# Patient Record
Sex: Male | Born: 1990 | Race: Black or African American | Hispanic: No | Marital: Married | Smoking: Never smoker
Health system: Southern US, Community
[De-identification: ages and names within clinical notes are randomized; demographics above are authoritative.]

---
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
## 2013-09-25 ENCOUNTER — Emergency Department: Payer: Self-pay | Admitting: Emergency Medicine

## 2013-09-27 LAB — BETA STREP CULTURE(ARMC)

## 2014-02-03 ENCOUNTER — Emergency Department: Payer: Self-pay | Admitting: Emergency Medicine

## 2014-02-04 LAB — CBC
HCT: 41.7 % (ref 40.0–52.0)
HGB: 14.1 g/dL (ref 13.0–18.0)
MCH: 29.1 pg (ref 26.0–34.0)
MCHC: 34 g/dL (ref 32.0–36.0)
MCV: 86 fL (ref 80–100)
Platelet: 238 10*3/uL (ref 150–440)
RBC: 4.86 10*6/uL (ref 4.40–5.90)
RDW: 13.5 % (ref 11.5–14.5)
WBC: 5.7 10*3/uL (ref 3.8–10.6)

## 2014-02-04 LAB — COMPREHENSIVE METABOLIC PANEL
ALK PHOS: 70 U/L
ALT: 34 U/L (ref 12–78)
Albumin: 4 g/dL (ref 3.4–5.0)
Anion Gap: 4 — ABNORMAL LOW (ref 7–16)
BILIRUBIN TOTAL: 0.4 mg/dL (ref 0.2–1.0)
BUN: 15 mg/dL (ref 7–18)
CALCIUM: 8.4 mg/dL — AB (ref 8.5–10.1)
CHLORIDE: 105 mmol/L (ref 98–107)
CO2: 28 mmol/L (ref 21–32)
Creatinine: 1.21 mg/dL (ref 0.60–1.30)
EGFR (African American): >60
Glucose: 86 mg/dL (ref 65–99)
Osmolality: 274 (ref 275–301)
POTASSIUM: 3.8 mmol/L (ref 3.5–5.1)
SGOT(AST): 31 U/L (ref 15–37)
SODIUM: 137 mmol/L (ref 136–145)
Total Protein: 7.1 g/dL (ref 6.4–8.2)
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
## 2020-04-08 ENCOUNTER — Ambulatory Visit: Payer: Self-pay | Attending: Family

## 2020-04-08 DIAGNOSIS — Z23 Encounter for immunization: Secondary | ICD-10-CM

## 2020-04-08 NOTE — Progress Notes (Signed)
   Covid-19 Vaccination [facility]  Name:  DONELLE BABA    MRN: [id number] DOB: [birth year]  04/08/2020  Mr. Salzwedel was observed post Covid-19 immunization for 15 minutes without incident. He was provided with Vaccine Information Sheet and instruction to access the V-Safe system.   Mr. Baston was instructed to call 911 with any severe reactions post vaccine: Marland Kitchen Difficulty breathing  . Swelling of face and throat  . A fast heartbeat  . A bad rash all over body  . Dizziness and weakness   Immunizations Administered    Name Date Dose VIS Date Route   Pfizer COVID-19 Vaccine 04/08/2020  4:36 PM 0.3 mL 01/14/2019 Intramuscular   Manufacturer: [employer], [employer]   Lot: [id number]   NDC: [address]-1

## 2020-05-11 ENCOUNTER — Ambulatory Visit: Payer: Self-pay | Attending: Internal Medicine

## 2020-11-25 ENCOUNTER — Ambulatory Visit: Payer: Self-pay

## 2022-08-14 ENCOUNTER — Emergency Department
Admission: EM | Admit: 2022-08-14 | Discharge: 2022-08-14 | Disposition: A | Discharge status: Home / Self Care | Payer: BC Managed Care – PPO | Attending: Emergency Medicine | Admitting: Emergency Medicine

## 2022-08-14 ENCOUNTER — Other Ambulatory Visit: Payer: Self-pay

## 2022-08-14 DIAGNOSIS — H6592 Unspecified nonsuppurative otitis media, left ear: Secondary | ICD-10-CM

## 2022-08-14 DIAGNOSIS — H73892 Other specified disorders of tympanic membrane, left ear: Secondary | ICD-10-CM | POA: Diagnosis not present

## 2022-08-14 DIAGNOSIS — H938X2 Other specified disorders of left ear: Secondary | ICD-10-CM | POA: Diagnosis present

## 2022-08-14 NOTE — Discharge Instructions (Addendum)
You likely have fluid behind your eardrum which is causing the sensation of fullness and difficulty hearing.  This typically resolves on its own.  If you are still having symptoms in about 1 week please follow-up with the ear nose and throat doctors.  You can take ibuprofen for any discomfort.

## 2022-08-14 NOTE — ED Triage Notes (Signed)
Pt presents to ED with c/o of L ear issues with hearing. Pt states this started 4 days ago. Pt states everything he hears sounds muffled in L ear. No fever or chills noted.

## 2022-08-14 NOTE — ED Notes (Signed)
See triage note   Presents with some discomfort in left ear  Denies nay pain or fever  Describes as "muffled feeling in ear"

## 2022-08-14 NOTE — ED Provider Notes (Signed)
[facility] Provider Note    Event Date/Time   First MD Initiated Contact with Patient 08/14/22 1116     (approximate)   History   Otalgia   HPI  Richard Massey is a 31 y.o. male with no stated past medical history who presents with left ear fullness.  Is been going on for 4 days.  He feels like he cannot fully hear out of the ear.  He can hear some but just feels muffled.  Denies associated pain.  He used hydrogen peroxide and got a significant amount of wax out on his own.  Denies history of similar.  Denies any recent cold symptoms or congestion or runny nose     History reviewed. No pertinent past medical history.  There are no problems to display for this patient.    Physical Exam  Triage Vital Signs: ED Triage Vitals  Enc Vitals Group     BP 08/14/22 1043 (!) 155/99     Pulse Rate 08/14/22 1042 89     Resp 08/14/22 1042 17     Temp 08/14/22 1042 98.2 F (36.8 C)     Temp Source 08/14/22 1042 Oral     SpO2 08/14/22 1042 97 %     Weight --      Height --      Head Circumference --      Peak Flow --      Pain Score 08/14/22 1042 0     Pain Loc --      Pain Edu? --      Excl. in GC? --     Most recent vital signs: Vitals:   08/14/22 1042 08/14/22 1043  BP:  (!) 155/99  Pulse: 89   Resp: 17   Temp: 98.2 F (36.8 C)   SpO2: 97%      General: Awake, no distress.  CV:  Good peripheral perfusion.  Resp:  Normal effort.  Abd:  No distention.  Neuro:             Awake, Alert, Oriented x 3  Other:  Left TM is mildly erythematous with what appears to be an effusion but I do see a light reflex its not bulging there is no drainage from the external canal   ED Results / Procedures / Treatments  Labs (all labs ordered are listed, but only abnormal results are displayed) Labs Reviewed - No data to display   EKG     RADIOLOGY    PROCEDURES:  Critical Care performed: No  Procedures   MEDICATIONS ORDERED IN  ED: Medications - No data to display   IMPRESSION / MDM / ASSESSMENT AND PLAN / ED COURSE  I reviewed the triage vital signs and the nursing notes.                              Patient's presentation is most consistent with acute, uncomplicated illness.  Differential diagnosis includes, but is not limited to, serous otitis media, otitis media, otitis externa, cerumen impaction  Patient is a 31 year old male presents with difficulty hearing and a full feeling in the left ear for the last 4 days.  He got a significant amount of cerumen out on his own by using hydrogen peroxide but still feels like he cannot fully hear out of the left ear.  Denies congestion or recent cold symptoms.  On exam the TM is slightly erythematous but is  not bulging there does appear to be an effusion.  His lack of pain and lack of bulge suggest against otitis media.  Exam and presentation not consistent with otitis externa.  Suspect middle ear effusion.  Recommended watchful waiting and follow-up with ENT if symptoms or not improved.      FINAL CLINICAL IMPRESSION(S) / ED DIAGNOSES   Final diagnoses:  Fluid level behind tympanic membrane of left ear     Rx / DC Orders   ED Discharge Orders     None        Note:  This document was prepared using Dragon voice recognition software and may include unintentional dictation errors.   Rada Hay, MD 08/14/22 [phone]

## 2024-05-13 ENCOUNTER — Ambulatory Visit
Admission: RE | Admit: 2024-05-13 | Discharge: 2024-05-13 | Disposition: A | Discharge status: Home / Self Care | Payer: Self-pay | Source: Ambulatory Visit | Attending: Emergency Medicine | Admitting: Emergency Medicine

## 2024-05-13 VITALS — BP 149/92 | HR 91 | Temp 99.3°F | Resp 18

## 2024-05-13 DIAGNOSIS — H6123 Impacted cerumen, bilateral: Secondary | ICD-10-CM

## 2024-05-13 DIAGNOSIS — H9192 Unspecified hearing loss, left ear: Secondary | ICD-10-CM

## 2024-05-13 NOTE — ED Triage Notes (Signed)
 Patient to Urgent Care with complaints of left sided ear fullness/ ringing/ muffled hearing.   Symptoms started Saturday. Reports started with URI symptoms.

## 2024-05-13 NOTE — ED Provider Notes (Signed)
 [facility]    CSN: [id number] Arrival date & time: 05/13/24  1301      History   Chief Complaint Chief Complaint  Patient presents with   Ear Fullness    Cold; sore throat, hard to swallow, no hearing in left ear - Entered by patient    HPI Richard Massey is a 33 y.o. male.  Patient presents with muffled hearing and fullness in his left ear intermittently x 3 days.  He denies ear pain, ear drainage, fever, cough, shortness of breath.  He states his symptoms improved when he takes a hot shower but then returned once he is cooled down.  He is concerned for earwax buildup.  The history is provided by the patient and medical records.    History reviewed. No pertinent past medical history.  There are no active problems to display for this patient.   History reviewed. No pertinent surgical history.     Home Medications    Prior to Admission medications   Not on File    Family History History reviewed. No pertinent family history.  Social History Social History   Tobacco Use   Smoking status: Never   Smokeless tobacco: Never  Vaping Use   Vaping status: Never Used  Substance Use Topics   Alcohol use: Yes    Comment: social   Drug use: Never     Allergies   Patient has no known allergies.   Review of Systems Review of Systems  Constitutional:  Negative for chills and fever.  HENT:  Positive for hearing loss. Negative for ear discharge, ear pain and sore throat.   Respiratory:  Negative for cough and shortness of breath.      Physical Exam Triage Vital Signs ED Triage Vitals  Encounter Vitals Group     BP 05/13/24 1314 (!) 149/92     Girls Systolic BP Percentile --      Girls Diastolic BP Percentile --      Boys Systolic BP Percentile --      Boys Diastolic BP Percentile --      Pulse Rate 05/13/24 1314 91     Resp 05/13/24 1314 18     Temp 05/13/24 1314 99.3 F (37.4 C)     Temp src --      SpO2 05/13/24 1314 97 %     Weight  --      Height --      Head Circumference --      Peak Flow --      Pain Score 05/13/24 1312 0     Pain Loc --      Pain Education --      Exclude from Growth Chart --    No data found.  Updated Vital Signs BP (!) 149/92   Pulse 91   Temp 99.3 F (37.4 C)   Resp 18   SpO2 97%   Visual Acuity Right Eye Distance:   Left Eye Distance:   Bilateral Distance:    Right Eye Near:   Left Eye Near:    Bilateral Near:     Physical Exam Constitutional:      General: He is not in acute distress. HENT:     Right Ear: Tympanic membrane normal.     Left Ear: Tympanic membrane normal.     Ears:     Comments: Excessive cerumen in both ear canals but able to visualize both TMs which are clear.    Nose: Nose normal.  Mouth/Throat:     Mouth: Mucous membranes are moist.     Pharynx: Oropharynx is clear.   Cardiovascular:     Rate and Rhythm: Normal rate and regular rhythm.     Heart sounds: Normal heart sounds.  Pulmonary:     Effort: Pulmonary effort is normal. No respiratory distress.     Breath sounds: Normal breath sounds.   Neurological:     Mental Status: He is alert.      UC Treatments / Results  Labs (all labs ordered are listed, but only abnormal results are displayed) Labs Reviewed - No data to display  EKG   Radiology No results found.  Procedures Procedures (including critical care time)  Medications Ordered in UC Medications - No data to display  Initial Impression / Assessment and Plan / UC Course  I have reviewed the triage vital signs and the nursing notes.  Pertinent labs & imaging results that were available during my care of the patient were reviewed by me and considered in my medical decision making (see chart for details).    Bilateral excessive cerumen in ear canals, decreased hearing of left ear.  Cerumen removed via irrigation by RN.  Canals and TMs noted to be clear after cerumen removal.  Patient reports complete relief of his  symptoms and hearing has returned to normal.  Instructed him to follow-up with his PCP as needed.  Education provided on earwax buildup.  He agrees to plan of care.  Final Clinical Impressions(s) / UC Diagnoses   Final diagnoses:  Excessive cerumen in both ear canals  Decreased hearing of left ear     Discharge Instructions      Follow up with your primary care provider as needed.        ED Prescriptions   None    PDMP not reviewed this encounter.   Corlis Burnard DEL, NP 05/13/24 [phone]

## 2024-05-13 NOTE — Discharge Instructions (Addendum)
 Follow up with your primary care provider as needed.
